# Patient Record
Sex: Female | Born: 1950 | Race: Black or African American | Hispanic: No | Marital: Married | Smoking: Former smoker
Health system: Southern US, Community
[De-identification: ages and names within clinical notes are randomized; demographics above are authoritative.]

## PROBLEM LIST (undated history)

## (undated) DIAGNOSIS — N189 Chronic kidney disease, unspecified: Secondary | ICD-10-CM

## (undated) DIAGNOSIS — IMO0001 Reserved for inherently not codable concepts without codable children: Secondary | ICD-10-CM

## (undated) DIAGNOSIS — D571 Sickle-cell disease without crisis: Secondary | ICD-10-CM

## (undated) DIAGNOSIS — I2510 Atherosclerotic heart disease of native coronary artery without angina pectoris: Secondary | ICD-10-CM

## (undated) DIAGNOSIS — E785 Hyperlipidemia, unspecified: Secondary | ICD-10-CM

## (undated) DIAGNOSIS — I10 Essential (primary) hypertension: Secondary | ICD-10-CM

## (undated) DIAGNOSIS — E119 Type 2 diabetes mellitus without complications: Secondary | ICD-10-CM

## (undated) DIAGNOSIS — D649 Anemia, unspecified: Secondary | ICD-10-CM

## (undated) DIAGNOSIS — I219 Acute myocardial infarction, unspecified: Secondary | ICD-10-CM

## (undated) HISTORY — PX: JOINT REPLACEMENT: SHX530

---

## 2009-07-12 ENCOUNTER — Emergency Department: Payer: Self-pay

## 2009-07-12 ENCOUNTER — Emergency Department: Payer: Self-pay | Admitting: Emergency Medicine

## 2009-08-22 ENCOUNTER — Ambulatory Visit: Payer: Self-pay | Admitting: Family Medicine

## 2009-08-26 ENCOUNTER — Ambulatory Visit: Payer: Self-pay | Admitting: Family Medicine

## 2009-09-29 ENCOUNTER — Ambulatory Visit: Payer: Self-pay | Admitting: Family Medicine

## 2009-10-26 ENCOUNTER — Ambulatory Visit: Payer: Self-pay | Admitting: Family Medicine

## 2010-06-02 ENCOUNTER — Ambulatory Visit: Payer: Self-pay | Admitting: Family Medicine

## 2010-11-26 HISTORY — PX: LEG SURGERY: SHX1003

## 2011-05-08 ENCOUNTER — Ambulatory Visit: Payer: Self-pay | Admitting: Orthopedic Surgery

## 2011-05-10 ENCOUNTER — Ambulatory Visit: Payer: Self-pay | Admitting: Orthopedic Surgery

## 2011-05-31 ENCOUNTER — Ambulatory Visit: Payer: Self-pay | Admitting: Internal Medicine

## 2011-06-06 DIAGNOSIS — I252 Old myocardial infarction: Secondary | ICD-10-CM | POA: Insufficient documentation

## 2013-04-22 ENCOUNTER — Ambulatory Visit: Payer: Self-pay | Admitting: Internal Medicine

## 2013-04-28 ENCOUNTER — Ambulatory Visit: Payer: Self-pay | Admitting: Internal Medicine

## 2013-06-30 ENCOUNTER — Ambulatory Visit: Payer: Self-pay | Admitting: Orthopedic Surgery

## 2013-09-11 ENCOUNTER — Ambulatory Visit: Payer: Self-pay | Admitting: Internal Medicine

## 2013-09-23 ENCOUNTER — Ambulatory Visit: Payer: Self-pay | Admitting: Internal Medicine

## 2013-09-26 ENCOUNTER — Ambulatory Visit: Payer: Self-pay | Admitting: Internal Medicine

## 2013-09-28 LAB — PROT IMMUNOELECT,UR-24HR

## 2013-10-14 ENCOUNTER — Ambulatory Visit: Payer: Self-pay | Admitting: Orthopedic Surgery

## 2013-10-14 DIAGNOSIS — I10 Essential (primary) hypertension: Secondary | ICD-10-CM

## 2013-10-14 LAB — URINALYSIS, COMPLETE
Bilirubin,UR: NEGATIVE
Glucose,UR: 50 mg/dL (ref 0–75)
Nitrite: NEGATIVE
RBC,UR: 1 /HPF (ref 0–5)
Specific Gravity: 1.015 (ref 1.003–1.030)
Squamous Epithelial: 1

## 2013-10-14 LAB — BASIC METABOLIC PANEL
Anion Gap: 8 (ref 7–16)
Chloride: 109 mmol/L — ABNORMAL HIGH (ref 98–107)
Co2: 21 mmol/L (ref 21–32)
Creatinine: 2.69 mg/dL — ABNORMAL HIGH (ref 0.60–1.30)
EGFR (African American): 21 — ABNORMAL LOW
Glucose: 164 mg/dL — ABNORMAL HIGH (ref 65–99)
Osmolality: 291 (ref 275–301)
Potassium: 4.8 mmol/L (ref 3.5–5.1)
Sodium: 138 mmol/L (ref 136–145)

## 2013-10-14 LAB — MRSA PCR SCREENING

## 2013-10-14 LAB — CBC
HCT: 36.1 % (ref 35.0–47.0)
Platelet: 226 10*3/uL (ref 150–440)
RBC: 4.45 10*6/uL (ref 3.80–5.20)

## 2013-10-14 LAB — PROTIME-INR
INR: 1
Prothrombin Time: 13.4 secs (ref 11.5–14.7)

## 2013-10-14 LAB — APTT: Activated PTT: <23 secs — ABNORMAL LOW (ref 23.6–35.9)

## 2013-10-14 LAB — SEDIMENTATION RATE: Erythrocyte Sed Rate: 59 mm/hr — ABNORMAL HIGH (ref 0–30)

## 2013-10-26 HISTORY — PX: OTHER SURGICAL HISTORY: SHX169

## 2013-10-26 HISTORY — PX: REPLACEMENT TOTAL KNEE: SUR1224

## 2013-11-12 ENCOUNTER — Inpatient Hospital Stay: Payer: Self-pay | Admitting: Orthopedic Surgery

## 2013-11-12 LAB — DRUG SCREEN, URINE
Amphetamines, Ur Screen: NEGATIVE (ref ?–1000)
Barbiturates, Ur Screen: NEGATIVE (ref ?–200)
Benzodiazepine, Ur Scrn: NEGATIVE (ref ?–200)
Cannabinoid 50 Ng, Ur ~~LOC~~: NEGATIVE (ref ?–50)
Cocaine Metabolite,Ur ~~LOC~~: NEGATIVE (ref ?–300)
MDMA (Ecstasy)Ur Screen: NEGATIVE (ref ?–500)
Opiate, Ur Screen: NEGATIVE (ref ?–300)
Phencyclidine (PCP) Ur S: NEGATIVE (ref ?–25)
Tricyclic, Ur Screen: NEGATIVE (ref ?–1000)

## 2013-11-13 LAB — HEMOGLOBIN: HGB: 10.4 g/dL — ABNORMAL LOW (ref 12.0–16.0)

## 2013-11-13 LAB — BASIC METABOLIC PANEL
Anion Gap: 6 — ABNORMAL LOW (ref 7–16)
BUN: 40 mg/dL — ABNORMAL HIGH (ref 7–18)
Co2: 20 mmol/L — ABNORMAL LOW (ref 21–32)
Creatinine: 3.14 mg/dL — ABNORMAL HIGH (ref 0.60–1.30)
EGFR (Non-African Amer.): 15 — ABNORMAL LOW
Glucose: 145 mg/dL — ABNORMAL HIGH (ref 65–99)
Osmolality: 284 (ref 275–301)
Sodium: 136 mmol/L (ref 136–145)

## 2013-11-14 LAB — BASIC METABOLIC PANEL WITH GFR
Anion Gap: 5 — ABNORMAL LOW (ref 7–16)
BUN: 34 mg/dL — ABNORMAL HIGH (ref 7–18)
Calcium, Total: 8.3 mg/dL — ABNORMAL LOW (ref 8.5–10.1)
Chloride: 108 mmol/L — ABNORMAL HIGH (ref 98–107)
Co2: 19 mmol/L — ABNORMAL LOW (ref 21–32)
Creatinine: 2.81 mg/dL — ABNORMAL HIGH (ref 0.60–1.30)
EGFR (African American): 20 — ABNORMAL LOW
EGFR (Non-African Amer.): 17 — ABNORMAL LOW
Glucose: 104 mg/dL — ABNORMAL HIGH (ref 65–99)
Osmolality: 272 (ref 275–301)
Potassium: 5.1 mmol/L (ref 3.5–5.1)
Sodium: 132 mmol/L — ABNORMAL LOW (ref 136–145)

## 2013-11-14 LAB — HEMOGLOBIN A1C: Hemoglobin A1C: 7.6 % — ABNORMAL HIGH (ref 4.2–6.3)

## 2013-11-15 LAB — BASIC METABOLIC PANEL
BUN: 52 mg/dL — ABNORMAL HIGH (ref 7–18)
Calcium, Total: 9.1 mg/dL (ref 8.5–10.1)
Creatinine: 3.72 mg/dL — ABNORMAL HIGH (ref 0.60–1.30)
EGFR (African American): 14 — ABNORMAL LOW
Potassium: 4.7 mmol/L (ref 3.5–5.1)
Sodium: 134 mmol/L — ABNORMAL LOW (ref 136–145)

## 2013-11-16 LAB — BASIC METABOLIC PANEL
Anion Gap: 7 (ref 7–16)
BUN: 56 mg/dL — ABNORMAL HIGH (ref 7–18)
Chloride: 109 mmol/L — ABNORMAL HIGH (ref 98–107)
Creatinine: 3.36 mg/dL — ABNORMAL HIGH (ref 0.60–1.30)
EGFR (Non-African Amer.): 14 — ABNORMAL LOW
Glucose: 143 mg/dL — ABNORMAL HIGH (ref 65–99)

## 2013-11-16 LAB — PATHOLOGY REPORT

## 2013-11-16 LAB — HEMOGLOBIN: HGB: 9.1 g/dL — ABNORMAL LOW (ref 12.0–16.0)

## 2014-02-17 DIAGNOSIS — N2581 Secondary hyperparathyroidism of renal origin: Secondary | ICD-10-CM | POA: Insufficient documentation

## 2014-06-21 DIAGNOSIS — N185 Chronic kidney disease, stage 5: Secondary | ICD-10-CM | POA: Insufficient documentation

## 2014-08-09 DIAGNOSIS — I2510 Atherosclerotic heart disease of native coronary artery without angina pectoris: Secondary | ICD-10-CM | POA: Insufficient documentation

## 2014-08-31 ENCOUNTER — Ambulatory Visit: Payer: Self-pay | Admitting: Vascular Surgery

## 2014-08-31 LAB — BASIC METABOLIC PANEL
Anion Gap: 6 — ABNORMAL LOW (ref 7–16)
BUN: 66 mg/dL — AB (ref 7–18)
CHLORIDE: 108 mmol/L — AB (ref 98–107)
CO2: 23 mmol/L (ref 21–32)
Calcium, Total: 8.4 mg/dL — ABNORMAL LOW (ref 8.5–10.1)
Creatinine: 6.92 mg/dL — ABNORMAL HIGH (ref 0.60–1.30)
EGFR (African American): 8 — ABNORMAL LOW
EGFR (Non-African Amer.): 6 — ABNORMAL LOW
GLUCOSE: 125 mg/dL — AB (ref 65–99)
Osmolality: 294 (ref 275–301)
Potassium: 4.2 mmol/L (ref 3.5–5.1)
SODIUM: 137 mmol/L (ref 136–145)

## 2014-08-31 LAB — CBC WITH DIFFERENTIAL/PLATELET
Basophil #: 0.1 10*3/uL (ref 0.0–0.1)
Basophil %: 1.4 %
EOS ABS: 0.2 10*3/uL (ref 0.0–0.7)
EOS PCT: 2.6 %
HCT: 29.8 % — ABNORMAL LOW (ref 35.0–47.0)
HGB: 9.4 g/dL — AB (ref 12.0–16.0)
LYMPHS ABS: 1.9 10*3/uL (ref 1.0–3.6)
Lymphocyte %: 25.5 %
MCH: 26.5 pg (ref 26.0–34.0)
MCHC: 31.5 g/dL — AB (ref 32.0–36.0)
MCV: 84 fL (ref 80–100)
Monocyte #: 0.6 x10 3/mm (ref 0.2–0.9)
Monocyte %: 8.5 %
Neutrophil #: 4.6 10*3/uL (ref 1.4–6.5)
Neutrophil %: 62 %
Platelet: 285 10*3/uL (ref 150–440)
RBC: 3.53 10*6/uL — AB (ref 3.80–5.20)
RDW: 14.7 % — ABNORMAL HIGH (ref 11.5–14.5)
WBC: 7.4 10*3/uL (ref 3.6–11.0)

## 2014-08-31 LAB — MRSA PCR SCREENING

## 2014-08-31 LAB — PROTIME-INR
INR: 1
Prothrombin Time: 13.1 secs (ref 11.5–14.7)

## 2014-09-07 ENCOUNTER — Ambulatory Visit: Payer: Self-pay | Admitting: Internal Medicine

## 2014-09-17 ENCOUNTER — Ambulatory Visit: Payer: Self-pay | Admitting: Vascular Surgery

## 2015-01-22 ENCOUNTER — Observation Stay: Payer: Self-pay | Admitting: Internal Medicine

## 2015-02-18 ENCOUNTER — Ambulatory Visit: Payer: Self-pay | Admitting: Internal Medicine

## 2015-03-18 ENCOUNTER — Ambulatory Visit
Admit: 2015-03-18 | Disposition: A | Discharge status: Home / Self Care | Payer: Self-pay | Attending: Internal Medicine | Admitting: Internal Medicine

## 2015-03-18 NOTE — Op Note (Signed)
PATIENT NAME:  Shelly Butler, HEGNA MR#:  [id number] DATE OF BIRTH:  May 21, 1951  DATE OF PROCEDURE:  11/12/2013  PREOPERATIVE DIAGNOSIS: Severe left knee osteoarthritis, posttraumatic.   POSTOPERATIVE DIAGNOSIS:  Severe left knee osteoarthritis, posttraumatic.   PROCEDURE: Left total knee replacement.   ANESTHESIA: Spinal with general.  SURGEON:  Kennedy Bucker, M.D.   ASSISTANT:  Devota Pace, nurse practitioner.   DESCRIPTION OF PROCEDURE: The patient was brought to the operating room and after adequate anesthesia was obtained, the left leg was prepped and draped in the usual sterile fashion. After appropriate patient identification and timeout procedures were completed, the leg was exsanguinated with an Esmarch and tourniquet raised to 300 mmHg. Going through a portion of the prior incision, a direct anterior midline skin incision was made, followed by a medial patellar arthrotomy. Additionally, at the start of the case, range of motion was approximately 20 to 60 degrees of motion. After medial parapatellar arthrotomy, inspection revealed significant bone loss within the knee with loss of the articular cartilage and significant defects, particularly the lateral tibial plateau. There was a very large loose body also removed at this point from the suprapatellar pouch measuring over 3 cm in diameter. The ACL was deficient. Anterior horns of the menisci excised, and the proximal tibia exposed to allow for application of the Medacta cutting block. With the Medacta block applied, the proximal tibia cut was carried out. Attention was then turned to the femur. Again, the Medacta cutting block applied, and an extra 2 mm was removed because of her significant flexion contracture. The tibia and femur sized to a 3, so the 3 cutting block was applied to the femur. Anterior, posterior and chamfer cuts made. Next, the residual bone from the tibia was removed along with the posterior horns of the menisci  and the PCL. A 3  tibial component trial was then placed, was pinned in place. Proximal reaming carried out, followed by the keel punch, left in place for trialing. The 3 femur was applied, and with a 10 mm insert, there was full extension and flexion could be brought up to 120 degrees with good patellofemoral tracking. Distal femoral drill holes were made. Following this, the trochlear groove cut was made. All trials were removed, and the patellar cut with the patellar cutting guide, and sized to a size 1 after 3 drill holes had been made. At this point, the tourniquet was let down to make sure there was no excessive bleeding. Curved osteotome was used to remove posterior osteophytes, and the knee, again, thoroughly irrigated and dried. After reinflating tourniquet, Exparel diluted with saline was infiltrated periarticularly, along with morphine and Sensorcaine 0.25% with epinephrine to aid in postop analgesia. With the bony surfaces dried, the tibial component was cemented into place first, followed by the 10 mm tibial insert with set screw, followed by the femoral component. The knee was held in extension as the cement set with the patellar button clamped into place and also cemented. After the cement had set, the tourniquet was let down. Excess cement removed, and the knee thoroughly irrigated. There was excellent patellofemoral tracking, full extension and flexion to 120. The arthrotomy was then repaired using a heavy quill suture, 2-0 quill subcutaneously and skin with staples. Xeroform, 4 x 4's, ABD, Webril, Polar Care, Ace and immobilizer applied. The patient was sent to the recovery room in stable condition.   ESTIMATED BLOOD LOSS: 100.   TOURNIQUET TIME: 84 minutes.   SPECIMENS: Cut ends of bone.  IMPLANTS: Medacta GMK Sphere System, size 3 left, 10 mm tibial insert; 3 left sphere femur, size 1 patella, and a 3 left fixed tibial baseplate.    ____________________________ Leitha SchullerMichael J. Orley Lawry,  MD mjm:dmm D: 11/12/2013 20:59:31 ET T: 11/12/2013 21:23:58 ET JOB#: [id number]  cc: Leitha SchullerMichael J. Meeyah Ovitt, MD, <Dictator> Leitha SchullerMICHAEL J Isabelle Matt MD ELECTRONICALLY SIGNED 11/13/2013 8:17

## 2015-03-18 NOTE — Consult Note (Signed)
Brief Consult Note: Diagnosis: Acute renal failure, DIABETES MELLITUS, CKD stage4, High Blood Pressure (Hypertension).   Patient was seen by consultant.   Consult note dictated.   Orders entered.   Comments: 62y/oF with PMH of High Blood Pressure (Hypertension), DIABETES MELLITUS and CKD stage 4 with [facility] admitted for left TKR and medical consult requested for acute renal failure and DIABETES MELLITUS  * Acute renal failure on top of CKD stage 4- cr in july 2014 is 2.5, in Nov 2014 is 2.69 and now is 3.1 will continue IV fluids and recheck in am  could be progressive renal func worsening vs ATN renal u/s ordered- known bilateral renal cysts [facility]  * Malignant HTN- IV hydralazine prn, hold enalapril added metoprolol and po hydralazine, continue PO norvasc  * DM- levemir, and novolog tid/ac, Sliding Scale Insulin sugars well controlled  * Left knee TKR- for arthritis, POD # 1 today, seems to be in lot of pain, pain control management per ortho, foley still in, likely discharge with home health ove rthe weekend if stable.  * DVT prophylaxis- xarelto.  Electronic Signatures: Enid BaasKalisetti, Ryleeann Urquiza (MD)  (Signed 19-Dec-14 13:10)  Authored: Brief Consult Note   Last Updated: 19-Dec-14 13:10 by Enid BaasKalisetti, Jessel Gettinger (MD)
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
## 2015-03-19 NOTE — Op Note (Signed)
PATIENT NAME:  Shelly Butler, Shelly Butler MR#:  [id number] DATE OF BIRTH:  06/16/1951  DATE OF PROCEDURE:  09/17/2014  PREOPERATIVE DIAGNOSES:  1. Stage 5 renal insufficiency.  2. Lack of appropriate intravenous access.  3. Hypertension.  4. Obesity.   POSTOPERATIVE DIAGNOSES: 1. Stage 5 renal insufficiency.  2. Lack of appropriate intravenous access.  3. Hypertension.  4. Obesity.   PROCEDURES PERFORMED: 1. Creation of a left arm brachial axillary dialysis graft using a 4 to 7 mm tapered Propaten graft.  2. Placement of a right forearm 5-French micro sheath for intravenous access using ultrasound.   PROCEDURE PERFORMED BY: Renford DillsGregory G. Beauregard Jarrells, MD.  ANESTHESIA: General by LMA.   FLUIDS: Per anesthesia record.   ESTIMATED BLOOD LOSS: Minimal.   SPECIMEN: None.   INDICATIONS: Shelly Butler is a 64 year old woman who is now experiencing uremic symptoms. Her creatinine clearance is approximately 15, and she will require dialysis in the very near future. She is, therefore, undergoing placement of an appropriate arm access to avoid catheter placement if possible. Risks and benefits were reviewed. All questions answered. The patient has agreed to proceed.   DESCRIPTION OF PROCEDURE: The patient is in the preoperative holding area. There have been multiple attempts made at starting an IV including the left hand as well as the feet. Attempts have all been unsuccessful and I am asked to evaluate. Ultrasound is placed in a sterile sleeve and the right forearm is prepped and draped in a sterile fashion. A vein which appears to be running parallel to the radial artery is identified. It is echolucent and compressible indicating patency. Image is recorded for the permanent record. Under real-time visualization, after 1% lidocaine has been infiltrated in the soft tissues, a micropuncture needle is inserted. Microwire is advanced, and a 5-French sheath is placed. Excellent blood return is noted, and her IV fluids are  hooked up and run freely. The sheath is then secured to the forearm with a sterile dressing.   The patient is then brought to the operating suite and placed in the supine position. After adequate general anesthesia is induced, she is positioned with her left arm extended palm upward, and the left arm is prepped and draped in a sterile fashion. Appropriate timeout is called.   A linear incision is made just above the antecubital crease overlying the impulse from the brachial artery and the dissection is carried down. Brachial artery is identified. It is looped proximally and distally without difficulty.   A linear incision is then made along the anterior axillary line and the soft tissues are divided. The fascia was entered and the neurovascular bundle is identified. The axillary vein is then clearly identified and looped proximally and distally with Silastic vessel loops.  A Gore tunneler is then used to create a subcutaneous path and a 4 to 7 mm tapered Propaten graft is pulled through the tunnel and the tunneler is removed. The graft is trimmed to an appropriate bevel at the 4-mm end and approximated to the brachial artery so that it would create a tension-free anastomosis. The brachial artery is then delivered into the surgical field and controlled with the Silastic vessel loops. Arteriotomy is made. A 6-0 Prolene stay suture is placed and an end graft to side brachial artery anastomosis fashioned with running CV-6 suture. Flushing maneuvers are performed and flow is re-established to the hand. Excellent pulse is maintained.  The graft is then flushed with heparinized saline and clamped just above the suture line. The graft  is then approximated to the axillary vein in its native bed, beveled to an appropriate shape and the vein is delivered into the surgical field, controlled with Silastic vessel loops. Venotomy is made, 6-0 Prolene stay sutures are placed and an end graft to side vein anastomosis is  fashioned with running CV-6 suture. Flushing maneuvers are performed and flow is established through the AV graft. Excellent thrill is noted. Radial pulse is maintained. Both wounds are then irrigated with sterile saline and closed in multiple layers using 3-0 Vicryl followed by 4-0 Monocryl subcuticular and Dermabond.   The patient tolerated the procedure well and there were no immediate complications.    ____________________________ Renford Dills, MD ggs:jh D: 09/17/2014 11:53:37 ET T: 09/17/2014 12:44:13 ET JOB#: [id number]  cc: Renford Dills, MD, <Dictator> Renford Dills MD ELECTRONICALLY SIGNED 09/29/2014 11:45

## 2015-03-19 NOTE — Consult Note (Signed)
PATIENT NAME:  Shelly Butler, Shelly Butler MR#:  [id number] DATE OF BIRTH:  13-May-1951  DATE OF CONSULTATION:  11/13/2013  ADMITTING PHYSICIAN:  Dr. Rosita KeaMenz   CONSULTING PHYSICIAN:  Enid Baasadhika Tyffani Foglesong, MD  PRIMARY CARE PHYSICIAN: Dr. Bari EdwardLaura Berglund.  PRIMARY NEPHROLOGIST: [facility]. [facility]ind[facility].    REASON FOR CONSULTATION: Acute renal failure.   BRIEF HISTORY OF PRESENT ILLNESS: Ms. Shelly HeinzCain is a 64 year old African American female with past medical history significant for hypertension and diabetes, history of pulmonary embolus off of anticoagulation currently and CKD stage IV with last known creatinine of 2.5 from her nephrologist's office in 05/2013 and 2.69 from 09/2013 here on blood work. She was admitted for an elective left hip total knee replacement surgery for worsening arthritis. Her blood work on postoperative day 1 revealed that her creatinine was 3.14, so medical consult was requested. The patient has a Foley catheter. Denies any dysuria or hematuria. Her urine output has been good in the last 24 hours and she had about 2.2 liters urine output with negative status.  She currently seems to be in a lot of pain as she just worked with physical therapy. The patient says that her kidney problem was first diagnosed about 4 years ago when her kidney functioning was 50% and [facility]. Since then, she has not followed up, up until this July when her kidney function was down to 25%. [facility], it seems like she had a 2.5 creatinine at the time.  She was also referred to see Hematology for an elevated serum kappa/lambda ratio to rule out myeloma; however, according to the Heme/Onc note, less suspicion for myeloma.   PAST MEDICAL HISTORY: 1.  Hypertension.  2.  Insulin-dependent diabetes mellitus.  3.  CKD stage III with baseline creatinine around 2.6.  4.  History of pulmonary emboli off of anticoagulation now.  5.  Osteoarthritis.  6.   Proteinuria.  7.  Bilateral renal cysts.  8.  Coronary artery disease with no intervention. 9.  History of non-ST segment elevation myocardial infarction in the past.  10.  Hyperlipidemia.  11.  Remote history of substance abuse.  12.  Tobacco use disorder.  13.  History of hepatitis C.  14.  Mildly elevated serum kappa-lambda ratio.    PAST SURGICAL HISTORY:  1.  Left leg surgery from trauma in the past.  2.  Left total knee replacement done yesterday.   ALLERGIES TO MEDICATIONS: No known drug allergies.   CURRENT HOME MEDICATIONS:  1.  Lantus 20 units once a day at bedtime.  2.  Lantus 35 units in the morning.  3.  Simvastatin 20 mg p.o. daily.  4.  NovoLog 15 units subcutaneously in a.m.  5.  NovoLog 10 units in p.m.  6.  Tramadol 50 mg 1 to 2 tablets every 4 hours as needed for pain.  7.  Enalapril 20 mg p.o. daily.  8.  Amlodipine 10 mg p.o. daily.  9.  Aspirin 325 mg p.o. daily.  10. Xanidine 1 tablet p.o. b.i.d. p.Butler.n.   SOCIAL HISTORY: The patient lives at home by herself. Smokes about 1/2 pack every day. Occasional alcohol use and remote history of substance abuse, including heroin and cocaine crack use.   FAMILY HISTORY: Significant for colon cancer and prostate cancer. One brother with kidney cancer. A couple of people in the family also on dialysis.   REVIEW OF SYSTEMS:  CONSTITUTIONAL: Positive for  fatigue. No weakness or fever.  EYES: Positive for blurred vision. No double vision, glaucoma, inflammation or cataracts.  ENT: No tinnitus, ear pain, hearing loss, epistaxis or discharge.  RESPIRATORY: No cough, wheeze, hemoptysis or COPD.  CARDIOVASCULAR: No chest pain, orthopnea, edema, arrhythmia, palpitations or syncope.  GASTROINTESTINAL: No nausea, vomiting, abdominal pain, diarrhea or constipation. No melena, no hematemesis.  GENITOURINARY: No dysuria, hematuria, renal calculus, frequency or incontinence.  ENDOCRINE: No polyuria, nocturia, thyroid problems, heat  or cold intolerance.  HEMATOLOGY: No anemia, easy bruising or bleeding.  SKIN: No acne, rash or lesions.  MUSCULOSKELETAL: Positive for left knee pain. No arthritis or gout.  NEUROLOGIC: No numbness, weakness, CVA, TIA or seizures.  PSYCHOLOGIC: No anxiety, insomnia, depression.   PHYSICAL EXAMINATION: VITAL SIGNS: Temperature 99.7 degrees Fahrenheit, pulse 100, respirations 22, blood pressure 195/97, pulse oximetry 95% on room air.  GENERAL: Heavily built, well-nourished female sitting in bed in mild distress secondary to left knee pain.  HEENT: Normocephalic, atraumatic. Pupils equal, round, reacting to light. Anicteric sclerae. Extraocular movements intact.  OROPHARYNX: Clear without erythema, mass or exudates.  NECK: Supple. No thyromegaly, JVD or carotid bruits.  No lymphadenopathy. Normal range of motion without pain.  LUNGS: Moving air bilaterally. No wheeze or crackles. No use of accessory muscles for breathing.  CARDIOVASCULAR: S1, S2 regular rate and rhythm, 2/6 systolic murmur heard. No rubs or gallops.  ABDOMEN: Obese, soft, nontender, nondistended. No hepatosplenomegaly. Normal bowel sounds.  EXTREMITIES: Left knee is extended and is in a soft splint secondary to her surgery. Right knee is normal range of motion. No pedal edema. No clubbing or cyanosis, 2+ dorsalis pedis pulses palpable bilaterally.  SKIN: No acne, rash or lesions.  LYMPHATICS: No cervical lymphadenopathy.  NEUROLOGIC: Cranial nerves II through X11 remain intact. No focal motor or sensory deficits.  PSYCHOLOGICAL:  The patient is awake, alert, oriented x 3.   LABORATORY DATA: Hemoglobin is 10.4.   Sodium 136, potassium 4.7, chloride 110, bicarbonate 20, BUN 40, creatinine 3.14, glucose 145 and calcium of 8.6.   Left knee x-ray showing status post left knee joint replacement. Urine drug screen was negative and she was admitted.  RECOMMENDATIONS:  A 64 year old African American female with a history of  hypertension, diabetes, stage IV CKD with last known creatinine of 2.69 from 09/2013 and was admitted for left total knee replacement and medical consult is requested for acute renal failure and diabetes mellitus. 1.  Acute renal failure on top of CKD stage IV. Creatinine from 05/2013 was 2.5 and in 09/2013 was 2.69 and now it is 3.1. Could be ATN from her surgery versus progressive renal function worsening. 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 Continue IV fluids for today and recheck in morning. Renal ultrasound has been ordered; however, the patient has known history of bilateral renal cysts.  We will get [facility] nephrology consultation as well.  2.  Malignant hypertension. IV hydralazine p.Butler.n. Hold her enalapril due to worsening renal failure. Continue her p.o. Norvasc and add p.o. metoprolol and hydralazine. Diabetes patient on Lantus at home, changed to Levemir while in the hospital and also on NovoLog t.i.d. prior to meals on sliding scale insulin as well. Her sugar seems to be very well controlled.  3.  Left knee total knee replacement surgery secondary to arthritis. Postoperative day 1 today, seems to be in a lot of pain. Pain control and further management per Orthopedic. Foley needs to be taken out. Likely discharge with home health over the  weekend if stable.  4.  Deep vein thrombosis prophylaxis. Is placed on Xarelto.    CODE STATUS: FULL CODE.   TIME SPENT ON CONSULTATION:  50 minutes.     ____________________________ Enid Baas, MD rk:dp D: 11/13/2013 14:01:43 ET T: 11/13/2013 16:09:24 ET JOB#: [id number]  cc: Enid Baas, MD, <Dictator> Bari Edward, MD Tyler Pita, MD  Enid Baas MD ELECTRONICALLY SIGNED 12/08/2013 14:22

## 2015-03-27 NOTE — H&P (Signed)
PATIENT NAME:  Shelly Butler, Shelly Butler MR#:  [id number] DATE OF BIRTH:  [birth year]  DATE OF ADMISSION:  01/22/2015  REFERRING PHYSICIAN: Coolidge Breeze, MD   PRIMARY CARE PHYSICIAN: Bari Edward, MD  CARDIOLOGIST: Lamar Blinks, MD, [facility].   CHIEF COMPLAINT: Chest pain.   HISTORY OF PRESENT ILLNESS: A 64 year old African-American female with a history of coronary artery disease status post PCI and stent placement; type 2 diabetes, insulin-requiring, complicated by end-stage renal disease, currently on hemodialysis. No chest pain. Describes events of the day. Actually finished hemodialysis without complication. Had chest pain, retrosternal in location, nonradiating, intensity 8/10, lasting 15 minutes in total duration. Unable to describe quality, states only "pain." No worsening or relieving factors. Has had associated shortness of breath. Otherwise, no further symptomatology. Currently symptom-free.  REVIEW OF SYSTEMS:  CONSTITUTIONAL: Denies fevers, chills. Positive for fatigue, weakness.  EYES: Denies blurred vision, double vision, eye pain.  EARS, NOSE, AND THROAT: Denies tinnitus, ear pain, hearing loss.  RESPIRATORY: Denies cough, wheeze. Positive for shortness of breath as described above.  CARDIOVASCULAR: Positive for chest pain as described above. Denies any orthopnea, edema, or palpitations. GASTROINTESTINAL: Denies nausea, vomiting, diarrhea, or abdominal pain. GENITOURINARY: Denies dysuria, hematuria. ENDOCRINE: Denies nocturia or thyroid problems. HEMATOLOGIC: Denies easy bruising, bleeding. SKIN: No rash, lesion. MUSCULOSKELETAL: Denies pain in neck, back, shoulder, knees, hips or arthritic symptoms.  NEUROLOGIC: Denies paralysis, paresthesias.  PSYCHIATRIC: Denies anxiety or depressive symptoms.  Otherwise, full review of systems performed by me is negative.   PAST MEDICAL HISTORY: Includes end-stage renal disease on hemodialysis; type 2 diabetes complicated by  end-stage renal disease, insulin-requiring; essential hypertension; coronary artery disease, status post PCI with stent placement; hyperlipidemia, unspecified.   SOCIAL HISTORY: Recently stopped smoking; has about a 40-pack-year history. Quit smoking 2 to 3 weeks ago. Denies any alcohol or drug use.   FAMILY HISTORY: Denies any known cardiovascular or pulmonary disorders.   ALLERGIES: No known drug allergies.   HOME MEDICATIONS: Include aspirin 325 mg p.o. q. daily, Norco 325/5 mg p.o. 1 tab every 6 hours as needed for pain, tramadol 50 mg p.o. 4 times a day as needed for pain, Tylenol 500 mg 2 tabs p.o. q. 6 hours as needed for pain, sodium bicarbonate 650 mg 2 tabs p.o. b.i.d., Lantus 35 units subcutaneously b.i.d., NovoLog 20 units subcutaneously 3 times a day, simvastatin 10 mg p.o. q. daily, metoprolol 50 mg p.o. b.i.d., Norvasc 10 mg p.o. q. daily, hydralazine 50 mg p.o. 3 times daily.   PHYSICAL EXAMINATION:  VITAL SIGNS: Temperature 97.8, heart rate 87, respirations 16, blood pressure 155/63, saturating 100% on room air. Weight 79 kg, BMI of 30.9.  GENERAL: Well-nourished, well-developed, African American female  currently in no acute distress.  HEAD: Normocephalic, atraumatic.  EYES: Pupils equal, round, reactive to light. Extraocular muscles intact. No scleral icterus.  MOUTH: Moist mucosal membrane, dentition intact. No abscess noted. EARS, NOSE, AND THROAT: Clear without exudates. No external lesions.  NECK: Supple. No thyromegaly. No nodules. No JVD.  PULMONARY: Clear to auscultation bilaterally without wheezes, rales, rhonchi. No use of accessory muscles. Good respiratory effort. CHEST: Nontender to palpation.  CARDIOVASCULAR: S1, S2. Regular rate and rhythm. No murmurs, rubs, or gallops. No edema. Pedal pulses 2+ bilaterally.  GASTROINTESTINAL: Soft, nontender, nondistended. No masses. Positive bowel sounds. No hepatosplenomegaly.  MUSCULOSKELETAL: No swelling, clubbing, or  edema. Range of motion full in all extremities.  NEUROLOGIC: Cranial nerves II through XII intact. No gross focal neurological deficits. Sensation  intact. Reflexes intact.  SKIN: No ulcerations, lesions, rashes, or cyanosis. Skin warm, dry. Turgor intact.  PSYCHIATRIC: Mood, affect within normal limits. Patient awake, alert, oriented x 3. Insight and judgment intact.   LABORATORY DATA: EKG performed reveals right bundle branch block; otherwise, no ST-T wave abnormalities. Chest x-ray performed reveals no acute cardiopulmonary process. Remainder of laboratory data: Sodium 136, potassium 3.7, chloride 99, bicarbonate 29, BUN 11, creatinine 3.66, glucose 123. Troponin 0.07. WBC 12.2, hemoglobin 9.5, platelets of 337,000.   ASSESSMENT AND PLAN: A 64 year old African American female with a history of coronary artery disease status post percutaneous coronary intervention with stent placement; type 2 diabetes complicated by end-stage renal disease, currently on hemodialysis; presenting with chest pain.  1.  Chest pain, central in location. Will admit to telemetry under observational status. Initiate aspirin and statin therapy. Trend cardiac enzymes x 3. Will consult cardiology. Does have a mildly elevated troponin at this time; however, in the setting of end-stage renal disease on dialysis, we will need to follow trend prior to initiating further anticoagulants.  2.  Type 2 diabetes complicated by renal disease. Continue with basal insulin Lantus. Will also add insulin sliding scale, q. 6 hour Accu-Cheks.  3.  Hypertension, essential. Continue with Norvasc, Lopressor, hydralazine.  4.  End-stage renal disease on hemodialysis. Actually just finished hemodialysis today. If she stays in the hospital longer than 24 hours, we will consult nephrology for continuation of dialysis.  5.  Venous thromboembolism prophylaxis with heparin subcutaneous.  CODE STATUS: The patient is full code.   TIME SPENT: 45 minutes.     ____________________________ Cletis Athensavid K. Hower, MD dkh:ST D: 01/22/2015 22:35:41 ET T: 01/23/2015 00:06:06 ET JOB#: [id number]  cc: Cletis Athensavid K. Hower, MD, <Dictator> DAVID Synetta ShadowK HOWER MD ELECTRONICALLY SIGNED 01/24/2015 4:19

## 2015-03-27 NOTE — Discharge Summary (Signed)
PATIENT NAME:  Shelly Butler, Shelly Butler MR#:  [id number] DATE OF BIRTH:  1950/12/29  DATE OF ADMISSION:  01/22/2015 DATE OF DISCHARGE:  01/24/2015  ADMITTING PHYSICIAN:  Dr. Angelica Ran.    DISCHARGING PHYSICIAN: Dr. Enid Baas.   PRIMARY NEPHROLOGIST:  Dr. Estill Bakes at [facility].   CONSULTATIONS IN THE HOSPITAL: Cardiology consultation with Dr. Lady Gary.   DISCHARGE DIAGNOSES:  1. Chest pain, likely stable angina versus musculoskeletal pain.  2. Acute on chronic anemia status post 1 unit packed red blood cell transfusion.  3. End-stage renal disease on Tuesday, Thursday, Saturday hemodialysis.  4. Diabetes mellitus.  5. Coronary artery disease.    DISCHARGE MEDICATIONS:   1. Norvasc 10 mg p.o. daily.  2. Aspirin 325 mg p.o. daily.  3. Tylenol 500 mg 1-2 tablets every 6 hours as needed.  4. Hydralazine 50 mg p.o. 3 times a day.  5. Sodium bicarbonate 1300 mg p.o. b.i.d.  6. Simvastatin 10 mg p.o. daily.  7. Metoprolol 50 mg p.o. b.i.d.   8. Lantus 35 units subcutaneously twice a day.   9. NovoLog FlexPen 20 units subcutaneous 3 times a day for blood sugar greater than 140. 10. Norco 5/325 mg 1 tablet every 6 hours as needed for pain.   DISCHARGE DIET: Renal diet.   DISCHARGE ACTIVITY: As tolerated.    FOLLOWUP INSTRUCTIONS:  1.  Follow up for dialysis tomorrow per schedule.  2.  Nephrology follow-up in 2-3 days.  3.  PCP followup in 1-2 weeks.   LABORATORIES AND IMAGING STUDIES PRIOR TO DISCHARGE:  1.  WBC 9.6, hemoglobin 9.9, hematocrit 31.0, platelet count 320,000.  2.  Sodium 140, potassium 3.5, chloride 101, bicarbonate 30, BUN 31, creatinine 7.07  glucose 95, and calcium of 8.3.  3.  Chest x-ray showing mild vascular congestion, cardiomegaly, bilateral atelectasis.   4.  LDL cholesterol 85, HDL 26, total cholesterol 045, triglycerides of 249.  5.  Troponins remained mildly elevated at 0.09.   BRIEF HOSPITAL COURSE: Shelly Butler is a 64 year old African-American  female with past medical history significant for coronary artery disease, end-stage renal disease on Tuesday, Thursday, Saturday hemodialysis, chronic anemia, who presented to the hospital secondary to chest pain that started after dialysis.   1.  Chest pain, anxiety versus musculoskeletal, versus stable angina, The patient has had CAD before, according to her it was diagnosed based on her blood work, I am thinking possibly troponins, however no further intervention was done and she was told she just had a mild heart attack.  She is on aspirin and statin and appropriate cardiac medications. Seen by Dr. Lady Gary in the hospital, recommended a stress test with her risk factors. It was done and stress test is noted to no significant wall motion abnormalities, estimated EF of  68%, normal left ventricular global function. No significant ischemia seen. The patient is being discharged in stable condition.  2.  Acute on chronic anemia. The patient's known baseline hemoglobin is around 9, however 2 weeks ago she was at a hospital in [place] for an acute respiratory condition and she says she has received blood transfusion at the time too. She complained of significant weakness. Hemoglobin was around 8 when she came in. With her active cardiac symptoms she was transfused with 1 unit packed RBC and red hemoglobin improved better than baseline at this time. Continue to monitor as an outpatient, might benefit from Epogen  with dialysis.  3.  End-stage renal disease on hemodialysis. 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 She continues to be on hemodialysis now, follows [facility] Gl[facility] on Tuesday, Thursday, Saturday schedule. Her last dialysis was done before admission on Saturday. She is due for dialysis tomorrow and was advised to  followup with [facility] for the same. She was not in acute distress or fluid overload while in the hospital.   4.  Her course has been otherwise uneventful in the hospital. All her other home medications are being continued without any changes.   DISCHARGE CONDITION: Stable.   DISCHARGE DISPOSITION: Home.   TIME SPENT ON DISCHARGE: 40 minutes.     ____________________________ Enid Baasadhika Lelaina Oatis, MD rk:bu D: 01/24/2015 15:25:35 ET T: 01/24/2015 20:16:59 ET JOB#: [id number]  cc: Enid Baasadhika Frankie Scipio, MD, <Dictator> Tyler PitaLindsay A. Kruska, MD Enid BaasADHIKA Brittain Hosie MD ELECTRONICALLY SIGNED 02/10/2015 17:55

## 2015-03-29 ENCOUNTER — Other Ambulatory Visit: Payer: Self-pay | Admitting: Vascular Surgery

## 2015-03-29 DIAGNOSIS — N186 End stage renal disease: Secondary | ICD-10-CM

## 2015-03-29 DIAGNOSIS — Z992 Dependence on renal dialysis: Principal | ICD-10-CM

## 2015-03-30 ENCOUNTER — Encounter: Admission: RE | Payer: Self-pay | Source: Ambulatory Visit

## 2015-03-30 ENCOUNTER — Ambulatory Visit: Payer: Medicaid Other

## 2015-03-30 SURGERY — A/V SHUNTOGRAM/FISTULAGRAM
Anesthesia: Moderate Sedation

## 2015-03-31 ENCOUNTER — Ambulatory Visit: Admission: RE | Admit: 2015-03-31 | Payer: Medicaid Other | Source: Ambulatory Visit | Admitting: Vascular Surgery

## 2015-04-28 DIAGNOSIS — M179 Osteoarthritis of knee, unspecified: Secondary | ICD-10-CM | POA: Insufficient documentation

## 2015-04-28 DIAGNOSIS — D631 Anemia in chronic kidney disease: Secondary | ICD-10-CM | POA: Insufficient documentation

## 2015-04-28 DIAGNOSIS — F5101 Primary insomnia: Secondary | ICD-10-CM | POA: Insufficient documentation

## 2015-04-28 DIAGNOSIS — M1710 Unilateral primary osteoarthritis, unspecified knee: Secondary | ICD-10-CM | POA: Insufficient documentation

## 2015-04-28 DIAGNOSIS — S46919A Strain of unspecified muscle, fascia and tendon at shoulder and upper arm level, unspecified arm, initial encounter: Secondary | ICD-10-CM | POA: Insufficient documentation

## 2015-04-28 DIAGNOSIS — Z86711 Personal history of pulmonary embolism: Secondary | ICD-10-CM | POA: Insufficient documentation

## 2015-04-28 DIAGNOSIS — N189 Chronic kidney disease, unspecified: Secondary | ICD-10-CM | POA: Insufficient documentation

## 2015-04-28 DIAGNOSIS — R0789 Other chest pain: Secondary | ICD-10-CM | POA: Insufficient documentation

## 2015-04-28 DIAGNOSIS — F39 Unspecified mood [affective] disorder: Secondary | ICD-10-CM | POA: Insufficient documentation

## 2015-04-28 DIAGNOSIS — I10 Essential (primary) hypertension: Secondary | ICD-10-CM | POA: Insufficient documentation

## 2015-04-28 DIAGNOSIS — Z992 Dependence on renal dialysis: Secondary | ICD-10-CM

## 2015-04-28 DIAGNOSIS — F17200 Nicotine dependence, unspecified, uncomplicated: Secondary | ICD-10-CM | POA: Insufficient documentation

## 2015-04-28 DIAGNOSIS — E119 Type 2 diabetes mellitus without complications: Secondary | ICD-10-CM | POA: Insufficient documentation

## 2015-04-28 DIAGNOSIS — M79606 Pain in leg, unspecified: Secondary | ICD-10-CM | POA: Insufficient documentation

## 2015-04-28 DIAGNOSIS — T148XXA Other injury of unspecified body region, initial encounter: Secondary | ICD-10-CM | POA: Insufficient documentation

## 2015-04-28 DIAGNOSIS — N186 End stage renal disease: Secondary | ICD-10-CM | POA: Insufficient documentation

## 2015-04-28 DIAGNOSIS — E785 Hyperlipidemia, unspecified: Secondary | ICD-10-CM | POA: Insufficient documentation

## 2015-05-02 ENCOUNTER — Ambulatory Visit: Payer: Medicare Other | Admitting: Anesthesiology

## 2015-05-02 ENCOUNTER — Ambulatory Visit
Admission: RE | Admit: 2015-05-02 | Discharge: 2015-05-02 | Disposition: A | Discharge status: Home / Self Care | Payer: Medicare Other | Source: Ambulatory Visit | Attending: Unknown Physician Specialty | Admitting: Unknown Physician Specialty

## 2015-05-02 ENCOUNTER — Encounter
Admission: RE | Disposition: A | Discharge status: Home / Self Care | Payer: Self-pay | Source: Ambulatory Visit | Attending: Unknown Physician Specialty

## 2015-05-02 ENCOUNTER — Encounter: Payer: Self-pay | Admitting: Anesthesiology

## 2015-05-02 DIAGNOSIS — I2510 Atherosclerotic heart disease of native coronary artery without angina pectoris: Secondary | ICD-10-CM | POA: Insufficient documentation

## 2015-05-02 DIAGNOSIS — Z8249 Family history of ischemic heart disease and other diseases of the circulatory system: Secondary | ICD-10-CM | POA: Diagnosis not present

## 2015-05-02 DIAGNOSIS — Z992 Dependence on renal dialysis: Secondary | ICD-10-CM | POA: Diagnosis not present

## 2015-05-02 DIAGNOSIS — Z841 Family history of disorders of kidney and ureter: Secondary | ICD-10-CM | POA: Insufficient documentation

## 2015-05-02 DIAGNOSIS — Z7982 Long term (current) use of aspirin: Secondary | ICD-10-CM | POA: Insufficient documentation

## 2015-05-02 DIAGNOSIS — D631 Anemia in chronic kidney disease: Secondary | ICD-10-CM | POA: Insufficient documentation

## 2015-05-02 DIAGNOSIS — D571 Sickle-cell disease without crisis: Secondary | ICD-10-CM | POA: Diagnosis not present

## 2015-05-02 DIAGNOSIS — Z794 Long term (current) use of insulin: Secondary | ICD-10-CM | POA: Insufficient documentation

## 2015-05-02 DIAGNOSIS — Z96652 Presence of left artificial knee joint: Secondary | ICD-10-CM | POA: Diagnosis not present

## 2015-05-02 DIAGNOSIS — E119 Type 2 diabetes mellitus without complications: Secondary | ICD-10-CM | POA: Diagnosis not present

## 2015-05-02 DIAGNOSIS — I252 Old myocardial infarction: Secondary | ICD-10-CM | POA: Insufficient documentation

## 2015-05-02 DIAGNOSIS — K640 First degree hemorrhoids: Secondary | ICD-10-CM | POA: Diagnosis not present

## 2015-05-02 DIAGNOSIS — F17200 Nicotine dependence, unspecified, uncomplicated: Secondary | ICD-10-CM | POA: Diagnosis not present

## 2015-05-02 DIAGNOSIS — N2581 Secondary hyperparathyroidism of renal origin: Secondary | ICD-10-CM | POA: Insufficient documentation

## 2015-05-02 DIAGNOSIS — Z808 Family history of malignant neoplasm of other organs or systems: Secondary | ICD-10-CM | POA: Diagnosis not present

## 2015-05-02 DIAGNOSIS — Z79899 Other long term (current) drug therapy: Secondary | ICD-10-CM | POA: Diagnosis not present

## 2015-05-02 DIAGNOSIS — Z9889 Other specified postprocedural states: Secondary | ICD-10-CM | POA: Insufficient documentation

## 2015-05-02 DIAGNOSIS — Z1211 Encounter for screening for malignant neoplasm of colon: Secondary | ICD-10-CM | POA: Diagnosis present

## 2015-05-02 DIAGNOSIS — N186 End stage renal disease: Secondary | ICD-10-CM | POA: Diagnosis not present

## 2015-05-02 DIAGNOSIS — Z8673 Personal history of transient ischemic attack (TIA), and cerebral infarction without residual deficits: Secondary | ICD-10-CM | POA: Insufficient documentation

## 2015-05-02 DIAGNOSIS — Z833 Family history of diabetes mellitus: Secondary | ICD-10-CM | POA: Diagnosis not present

## 2015-05-02 DIAGNOSIS — Z800 Family history of malignant neoplasm of digestive organs: Secondary | ICD-10-CM | POA: Diagnosis not present

## 2015-05-02 DIAGNOSIS — I120 Hypertensive chronic kidney disease with stage 5 chronic kidney disease or end stage renal disease: Secondary | ICD-10-CM | POA: Insufficient documentation

## 2015-05-02 DIAGNOSIS — Z86711 Personal history of pulmonary embolism: Secondary | ICD-10-CM | POA: Diagnosis not present

## 2015-05-02 DIAGNOSIS — Z8042 Family history of malignant neoplasm of prostate: Secondary | ICD-10-CM | POA: Diagnosis not present

## 2015-05-02 DIAGNOSIS — M1990 Unspecified osteoarthritis, unspecified site: Secondary | ICD-10-CM | POA: Insufficient documentation

## 2015-05-02 HISTORY — DX: Atherosclerotic heart disease of native coronary artery without angina pectoris: I25.10

## 2015-05-02 HISTORY — PX: COLONOSCOPY: SHX5424

## 2015-05-02 HISTORY — DX: Acute myocardial infarction, unspecified: I21.9

## 2015-05-02 HISTORY — DX: Chronic kidney disease, unspecified: N18.9

## 2015-05-02 HISTORY — DX: Sickle-cell disease without crisis: D57.1

## 2015-05-02 HISTORY — DX: Reserved for inherently not codable concepts without codable children: IMO0001

## 2015-05-02 HISTORY — DX: Anemia, unspecified: D64.9

## 2015-05-02 HISTORY — DX: Type 2 diabetes mellitus without complications: E11.9

## 2015-05-02 HISTORY — DX: Essential (primary) hypertension: I10

## 2015-05-02 HISTORY — DX: Hyperlipidemia, unspecified: E78.5

## 2015-05-02 LAB — URINE DRUG SCREEN, QUALITATIVE (ARMC ONLY)
AMPHETAMINES, UR SCREEN: NOT DETECTED
BARBITURATES, UR SCREEN: NOT DETECTED
Benzodiazepine, Ur Scrn: NOT DETECTED
Cannabinoid 50 Ng, Ur ~~LOC~~: NOT DETECTED
Cocaine Metabolite,Ur ~~LOC~~: NOT DETECTED
MDMA (ECSTASY) UR SCREEN: NOT DETECTED
Methadone Scn, Ur: NOT DETECTED
OPIATE, UR SCREEN: NOT DETECTED
Phencyclidine (PCP) Ur S: NOT DETECTED
TRICYCLIC, UR SCREEN: NOT DETECTED

## 2015-05-02 LAB — GLUCOSE, CAPILLARY: Glucose-Capillary: 95 mg/dL (ref 65–99)

## 2015-05-02 SURGERY — COLONOSCOPY
Anesthesia: General

## 2015-05-02 MED ORDER — MIDAZOLAM HCL 2 MG/2ML IJ SOLN
INTRAMUSCULAR | Status: DC | PRN
  Administered 2015-05-02: 1 mg via INTRAVENOUS

## 2015-05-02 MED ORDER — PROPOFOL INFUSION 10 MG/ML OPTIME
INTRAVENOUS | Status: DC | PRN
  Administered 2015-05-02: 75 ug/kg/min via INTRAVENOUS

## 2015-05-02 MED ORDER — SODIUM CHLORIDE 0.9 % IV SOLN: INTRAVENOUS | Status: DC

## 2015-05-02 MED ORDER — SODIUM CHLORIDE 0.9 % IV SOLN
INTRAVENOUS | Status: DC
  Administered 2015-05-02: 11:00:00 via INTRAVENOUS

## 2015-05-02 MED ORDER — FENTANYL CITRATE (PF) 100 MCG/2ML IJ SOLN
INTRAMUSCULAR | Status: DC | PRN
  Administered 2015-05-02: 50 ug via INTRAVENOUS

## 2015-05-02 MED ORDER — PIPERACILLIN-TAZOBACTAM 3.375 G IVPB
3.3750 g | Freq: Once | INTRAVENOUS | Status: AC
  Administered 2015-05-02: 3.375 g via INTRAVENOUS
  Filled 2015-05-02: qty 50

## 2015-05-02 NOTE — Anesthesia Postprocedure Evaluation (Signed)
  Anesthesia Post-op Note  Patient: Shelly Butler  Procedure(s) Performed: Procedure(s): COLONOSCOPY (N/A)  Anesthesia type:General  Patient location: PACU  Post pain: Pain level controlled  Post assessment: Post-op Vital signs reviewed, Patient's Cardiovascular Status Stable, Respiratory Function Stable, Patent Airway and No signs of Nausea or vomiting  Post vital signs: Reviewed and stable  Last Vitals:  Filed Vitals:   05/02/15 1200  BP: 177/75  Pulse: 66  Temp:   Resp: 14    Level of consciousness: awake, alert  and patient cooperative  Complications: No apparent anesthesia complications

## 2015-05-02 NOTE — Op Note (Signed)
[facility] Patient Name: Shelly Butler Procedure Date: 05/02/2015 11:24 AM MRN: [id number] Account #: [id number] Date of Birth: October 18, 1951 Admit Type: Outpatient Age: [age] [facility] END[facility] ROOM 1 Gender: Female Note Status: Finalized Procedure:         Colonoscopy Indications:       Screening for colorectal malignant neoplasm Providers:         Scot Junobert T. Jeremiah Tarpley, MD Referring MD:      Bari EdwardLaura Berglund, MD (Referring MD) Medicines:         Propofol per Anesthesia Complications:     No immediate complications. Procedure:         Pre-Anesthesia Assessment:                    - After reviewing the risks and benefits, the patient was                     deemed in satisfactory condition to undergo the procedure.                    After obtaining informed consent, the colonoscope was                     passed under direct vision. Throughout the procedure, the                     patient's blood pressure, pulse, and oxygen saturations                     were monitored continuously. The Olympus PCF-160AL                     colonoscope (S#. [id number]) was introduced through the anus                     and advanced to the the cecum, identified by appendiceal                     orifice and ileocecal valve. The colonoscopy was performed                     without difficulty. The patient tolerated the procedure                     well. The quality of the bowel preparation was excellent. Findings:      Internal hemorrhoids were found during endoscopy. The hemorrhoids were       small and Grade I (internal hemorrhoids that do not prolapse).      The exam was otherwise without abnormality. Impression:        - Internal hemorrhoids.                    - The examination was otherwise normal.                    - No specimens collected. Recommendation:    - Repeat colonoscopy in 10 years for screening purposes. Scot Junobert T Samanatha Brammer, MD 05/02/2015 11:50:42 AM This report  has been signed electronically. Number of Addenda: 0 Note Initiated On: 05/02/2015 11:24 AM Total Procedure Duration: 0 hours 8 minutes 7 seconds       [facility]
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
## 2015-05-02 NOTE — Transfer of Care (Signed)
Immediate Anesthesia Transfer of Care Note  Patient: Shelly Butler  Procedure(s) Performed: Procedure(s): COLONOSCOPY (N/A)  Patient Location: PACU  Anesthesia Type:General  Level of Consciousness: awake  Airway & Oxygen Therapy: Patient Spontanous Breathing  Post-op Assessment: Report given to RN  Post vital signs: stable  Last Vitals:  Filed Vitals:   05/02/15 1151  BP: 158/68  Pulse: 80  Temp: 36.6 C  Resp: 16    Complications: No apparent anesthesia complications

## 2015-05-02 NOTE — Anesthesia Preprocedure Evaluation (Addendum)
Anesthesia Evaluation  Patient identified by MRN, date of birth, ID band Patient awake    Reviewed: Allergy & Precautions, NPO status , Patient's Chart, lab work & pertinent test results, reviewed documented beta blocker date and time   Airway Mallampati: III  TM Distance: >3 FB     Dental  (+) Poor Dentition, Missing, Chipped   Pulmonary former smoker,          Cardiovascular hypertension,     Neuro/Psych PSYCHIATRIC DISORDERS  Neuromuscular disease    GI/Hepatic   Endo/Other  diabetes, Type 2  Renal/GU ESRFRenal disease     Musculoskeletal  (+) Arthritis -,   Abdominal   Peds  Hematology  (+) anemia ,   Anesthesia Other Findings Fistula in L arm.  Reproductive/Obstetrics                           Anesthesia Physical Anesthesia Plan  ASA: III  Anesthesia Plan: General   Post-op Pain Management:    Induction: Intravenous  Airway Management Planned: Nasal Cannula  Additional Equipment:   Intra-op Plan:   Post-operative Plan:   Informed Consent: I have reviewed the patients History and Physical, chart, labs and discussed the procedure including the risks, benefits and alternatives for the proposed anesthesia with the patient or authorized representative who has indicated his/her understanding and acceptance.     Plan Discussed with:   Anesthesia Plan Comments:         Anesthesia Quick Evaluation

## 2015-05-05 ENCOUNTER — Encounter: Payer: Self-pay | Admitting: Unknown Physician Specialty

## 2015-05-06 ENCOUNTER — Other Ambulatory Visit: Payer: Self-pay

## 2015-05-06 DIAGNOSIS — Z9119 Patient's noncompliance with other medical treatment and regimen: Secondary | ICD-10-CM | POA: Insufficient documentation

## 2015-05-06 DIAGNOSIS — Z91199 Patient's noncompliance with other medical treatment and regimen due to unspecified reason: Secondary | ICD-10-CM | POA: Insufficient documentation

## 2015-05-06 MED ORDER — AMLODIPINE BESYLATE 10 MG PO TABS: 10.0000 mg | ORAL_TABLET | Freq: Every day | ORAL | Status: DC

## 2015-05-13 ENCOUNTER — Ambulatory Visit: Payer: Self-pay | Admitting: Internal Medicine

## 2015-05-27 ENCOUNTER — Other Ambulatory Visit: Payer: Self-pay

## 2015-05-27 MED ORDER — SIMVASTATIN 10 MG PO TABS: 10.0000 mg | ORAL_TABLET | Freq: Every day | ORAL | Status: DC

## 2015-06-06 ENCOUNTER — Other Ambulatory Visit: Payer: Self-pay | Admitting: Internal Medicine

## 2015-06-06 MED ORDER — ACCU-CHEK SOFTCLIX LANCET DEV MISC: Status: AC

## 2015-06-06 MED ORDER — GLUCOSE BLOOD VI STRP: ORAL_STRIP | Status: AC

## 2015-07-29 ENCOUNTER — Other Ambulatory Visit: Payer: Self-pay

## 2015-10-08 ENCOUNTER — Other Ambulatory Visit: Payer: Self-pay | Admitting: Internal Medicine

## 2016-02-03 ENCOUNTER — Other Ambulatory Visit: Payer: Self-pay | Admitting: Internal Medicine

## 2016-08-07 ENCOUNTER — Other Ambulatory Visit: Payer: Self-pay | Admitting: Internal Medicine

## 2016-08-25 IMAGING — CR DG CHEST 1V PORT
1 series · 1 of 1 positions shown · non-contrast
Comparison: 08/31/2014

CLINICAL DATA: Anxiety attack after dialysis, shortness of breath,
chest pain with deep inspiration

EXAM:
PORTABLE CHEST - 1 VIEW

[ap]
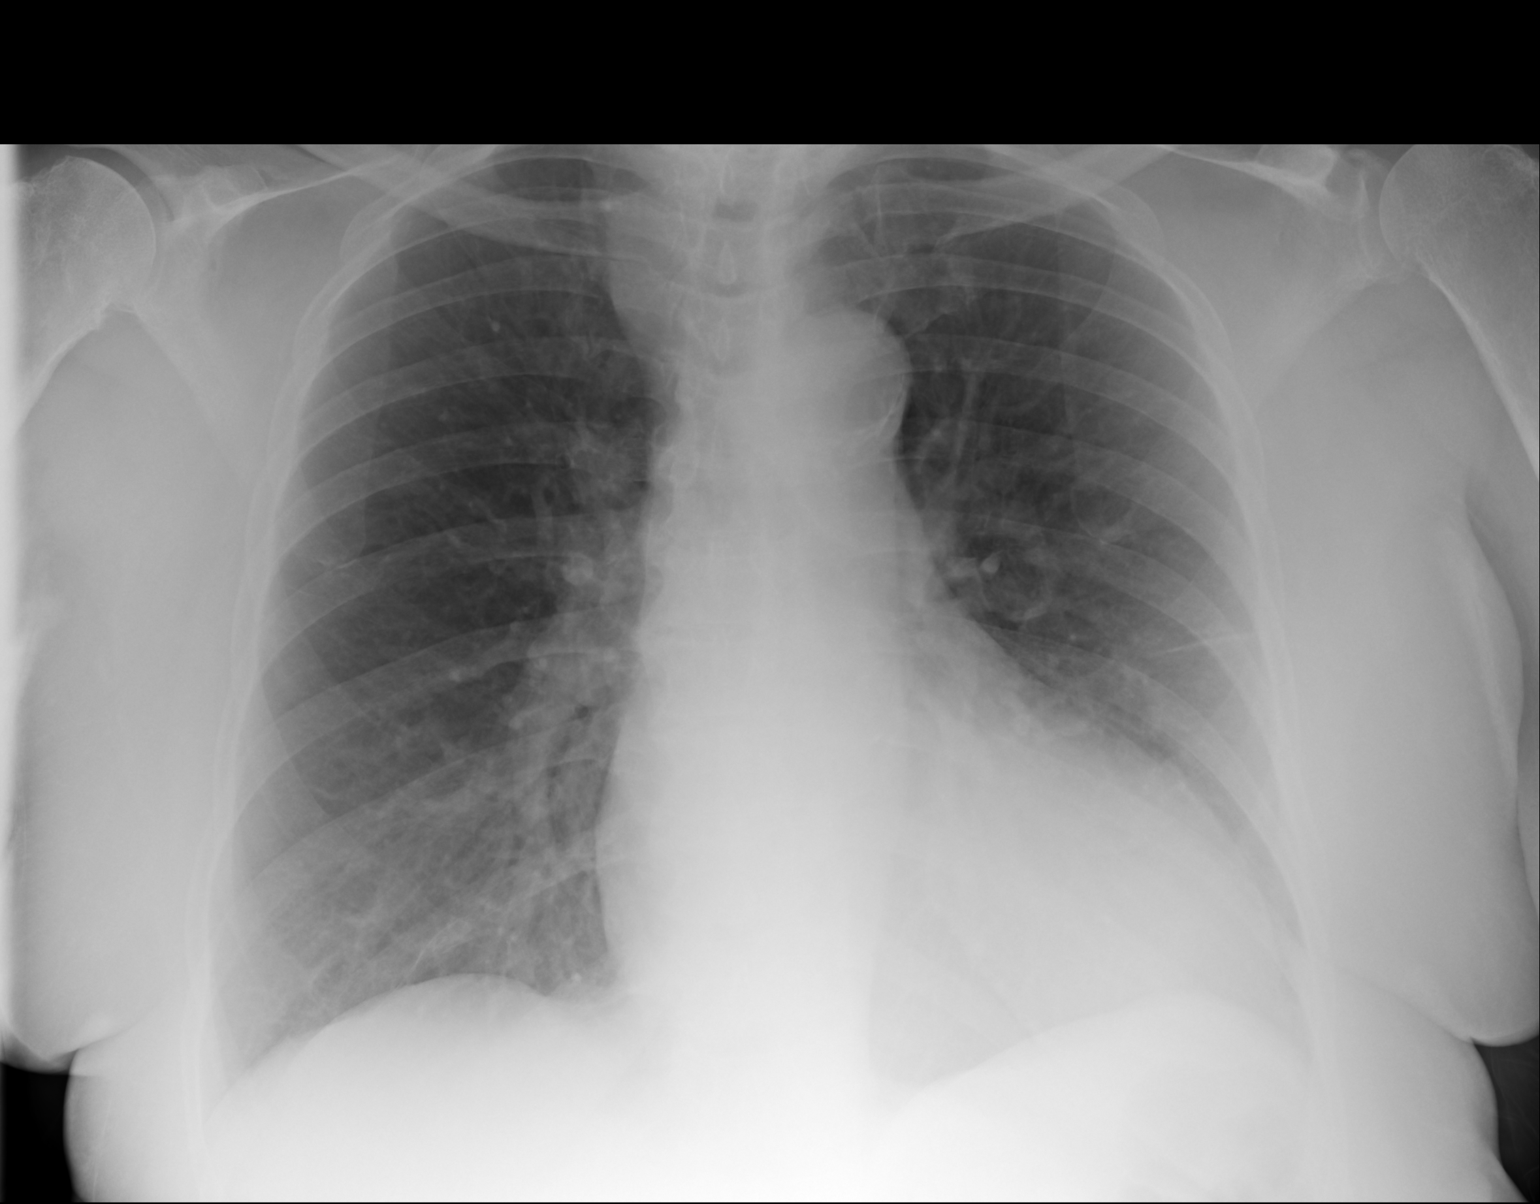

[1 of 1 positions shown; findings below may reference images not displayed]

FINDINGS: Increased interstitial markings without frank interstitial edema. No
focal consolidation. No pleural effusion or pneumothorax.

The heart is top-normal in size.
IMPRESSION: Increased interstitial markings without frank interstitial edema.

No evidence of acute cardiopulmonary disease.

## 2016-08-26 IMAGING — CR DG CHEST 1V PORT
1 series · 1 of 1 positions shown · non-contrast
Comparison: Chest radiograph performed earlier today at [DATE] p.m.

CLINICAL DATA: Right IJ line readjustment.  Subsequent encounter.

EXAM:
PORTABLE CHEST - 1 VIEW

[ap]
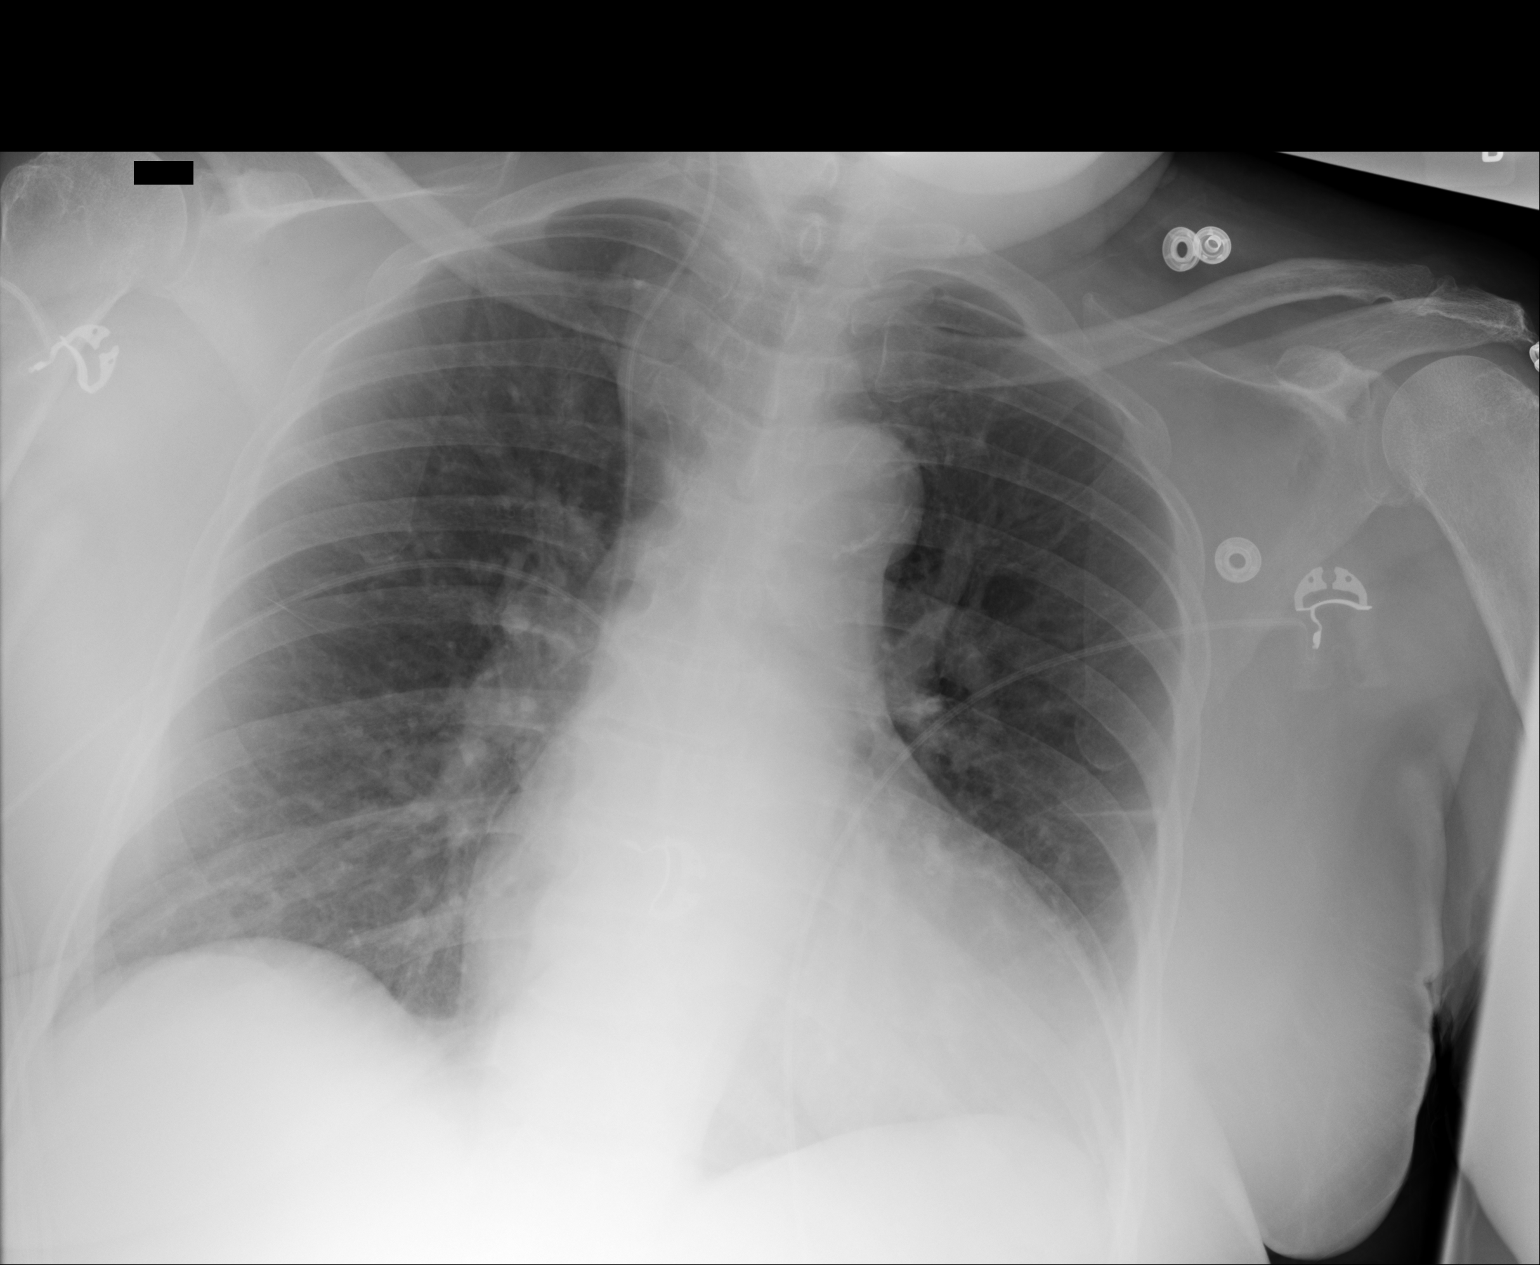

[1 of 1 positions shown; findings below may reference images not displayed]

FINDINGS: The patient's right IJ line is noted ending about the distal SVC.

Mild vascular congestion is noted. Mild bibasilar atelectasis is
again seen. No pleural effusion or pneumothorax is identified.

The cardiomediastinal silhouette is mildly enlarged. No acute
osseous abnormalities are seen.
IMPRESSION: 1. Right IJ line noted ending about the distal SVC.
2. Mild vascular congestion and mild cardiomegaly noted. Mild
bibasilar atelectasis again seen.

## 2016-08-26 IMAGING — CR DG CHEST 1V PORT
1 series · 1 of 1 positions shown · non-contrast
Comparison: Chest radiograph performed 01/22/2015

CLINICAL DATA: Right central line placement.

EXAM:
PORTABLE CHEST - 1 VIEW

[ap]
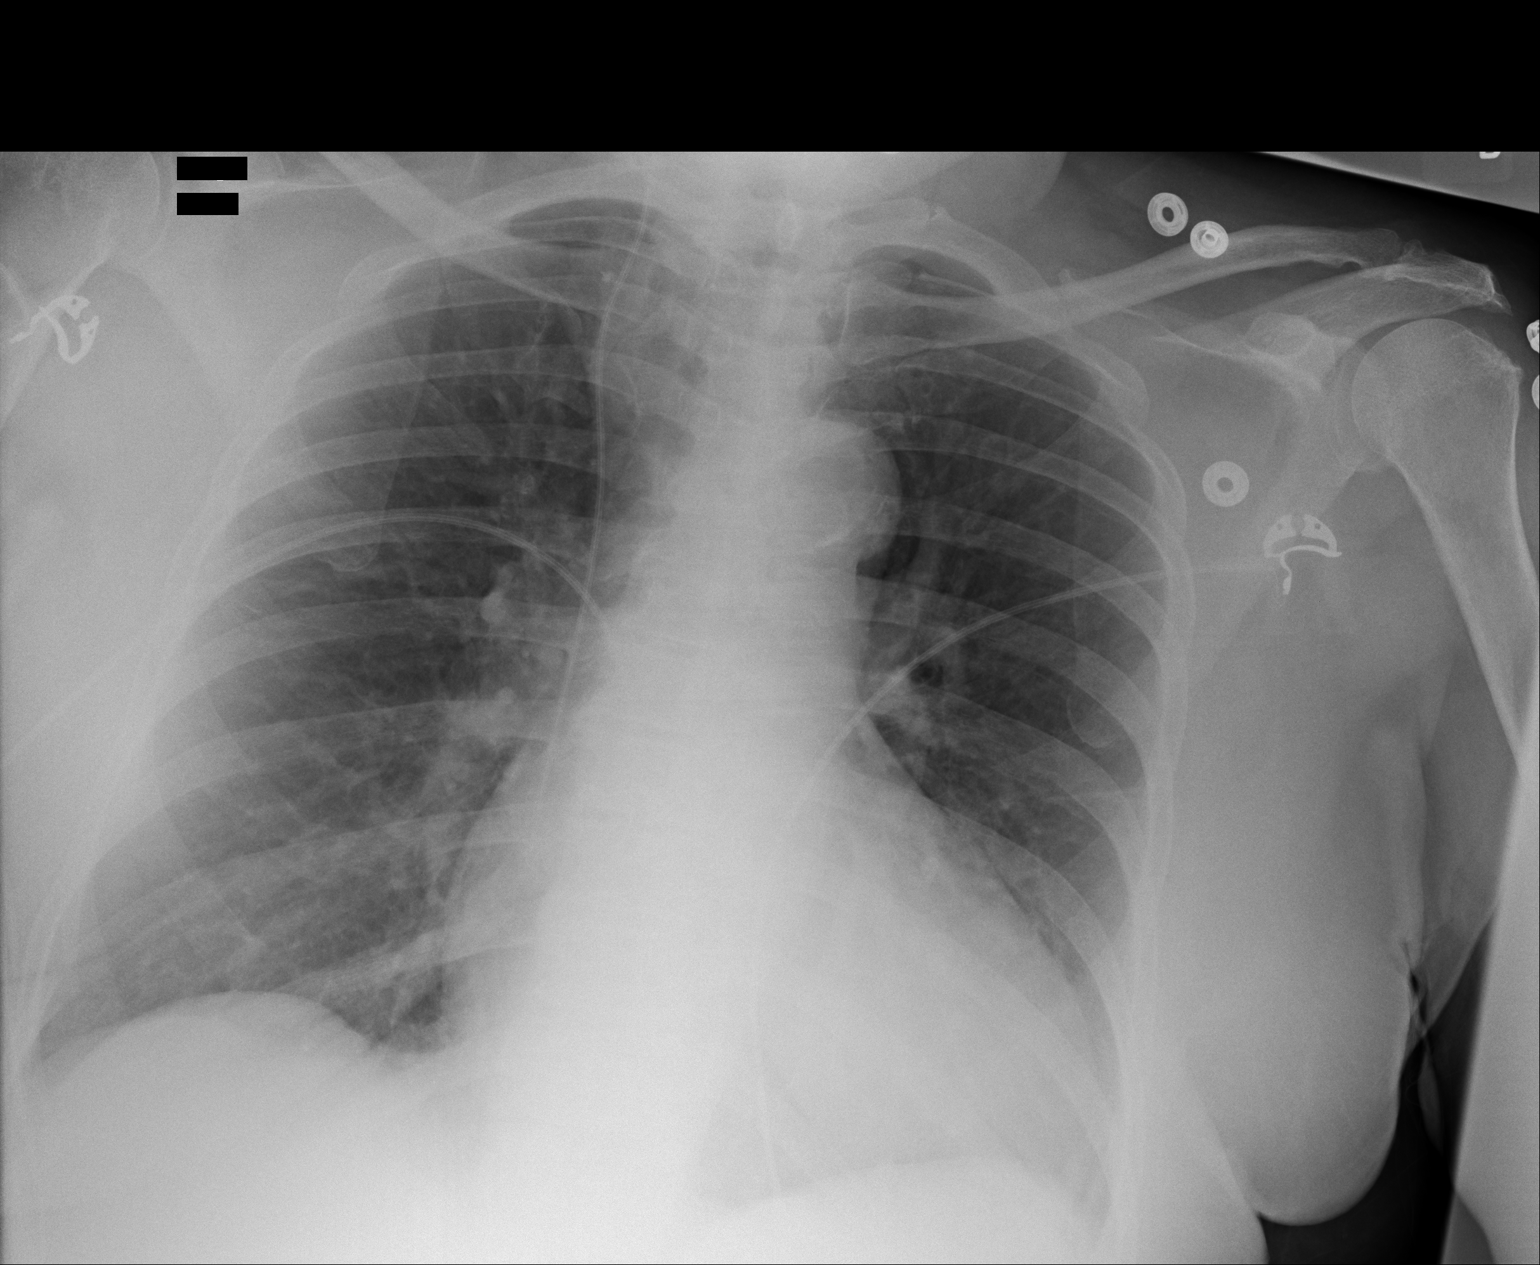

[1 of 1 positions shown; findings below may reference images not displayed]

FINDINGS: The patient's right IJ line is noted ending about the cavoatrial
junction.

Minimal bibasilar atelectasis is noted. Pulmonary vascularity is at
the upper limits of normal. No definite pleural effusion or
pneumothorax is seen.

The cardiomediastinal silhouette is mildly enlarged. No acute
osseous abnormalities are identified.
IMPRESSION: 1. Right IJ line noted ending about the cavoatrial junction.
2. Minimal bibasilar atelectasis noted.  Mild cardiomegaly.
